# Patient Record
Sex: Female | Born: 1990
Health system: Southern US, Community
[De-identification: ages and names within clinical notes are randomized; demographics above are authoritative.]

## PROBLEM LIST (undated history)

## (undated) DIAGNOSIS — F32A Depression, unspecified: Secondary | ICD-10-CM

## (undated) DIAGNOSIS — F419 Anxiety disorder, unspecified: Secondary | ICD-10-CM

## (undated) DIAGNOSIS — S46219A Strain of muscle, fascia and tendon of other parts of biceps, unspecified arm, initial encounter: Secondary | ICD-10-CM

## (undated) HISTORY — PX: RHINOPLASTY: SUR1284

## (undated) HISTORY — PX: SEPTOPLASTY: SUR1290

---

## 2018-07-30 DIAGNOSIS — Z13220 Encounter for screening for lipoid disorders: Secondary | ICD-10-CM | POA: Diagnosis not present

## 2018-07-30 DIAGNOSIS — Z130 Encounter for screening for diseases of the blood and blood-forming organs and certain disorders involving the immune mechanism: Secondary | ICD-10-CM | POA: Diagnosis not present

## 2018-07-30 DIAGNOSIS — Z0000 Encounter for general adult medical examination without abnormal findings: Secondary | ICD-10-CM | POA: Diagnosis not present

## 2018-07-30 DIAGNOSIS — N926 Irregular menstruation, unspecified: Secondary | ICD-10-CM | POA: Diagnosis not present

## 2018-07-30 DIAGNOSIS — Z01419 Encounter for gynecological examination (general) (routine) without abnormal findings: Secondary | ICD-10-CM | POA: Diagnosis not present

## 2018-07-30 DIAGNOSIS — Z6821 Body mass index (BMI) 21.0-21.9, adult: Secondary | ICD-10-CM | POA: Diagnosis not present

## 2018-08-18 DIAGNOSIS — N926 Irregular menstruation, unspecified: Secondary | ICD-10-CM | POA: Diagnosis not present

## 2018-08-18 DIAGNOSIS — Z30430 Encounter for insertion of intrauterine contraceptive device: Secondary | ICD-10-CM | POA: Diagnosis not present

## 2018-08-18 DIAGNOSIS — N83201 Unspecified ovarian cyst, right side: Secondary | ICD-10-CM | POA: Diagnosis not present

## 2018-10-14 DIAGNOSIS — Z30431 Encounter for routine checking of intrauterine contraceptive device: Secondary | ICD-10-CM | POA: Diagnosis not present

## 2018-10-14 DIAGNOSIS — N83201 Unspecified ovarian cyst, right side: Secondary | ICD-10-CM | POA: Diagnosis not present

## 2019-01-22 ENCOUNTER — Other Ambulatory Visit: Payer: Self-pay

## 2019-01-22 DIAGNOSIS — Z20822 Contact with and (suspected) exposure to covid-19: Secondary | ICD-10-CM

## 2019-01-22 DIAGNOSIS — R6889 Other general symptoms and signs: Secondary | ICD-10-CM | POA: Diagnosis not present

## 2019-01-23 LAB — NOVEL CORONAVIRUS, NAA: SARS-CoV-2, NAA: NOT DETECTED

## 2019-03-15 DIAGNOSIS — M62830 Muscle spasm of back: Secondary | ICD-10-CM | POA: Diagnosis not present

## 2019-03-15 DIAGNOSIS — M9903 Segmental and somatic dysfunction of lumbar region: Secondary | ICD-10-CM | POA: Diagnosis not present

## 2019-03-15 DIAGNOSIS — M9902 Segmental and somatic dysfunction of thoracic region: Secondary | ICD-10-CM | POA: Diagnosis not present

## 2019-03-15 DIAGNOSIS — M9905 Segmental and somatic dysfunction of pelvic region: Secondary | ICD-10-CM | POA: Diagnosis not present

## 2019-05-05 DIAGNOSIS — Z20828 Contact with and (suspected) exposure to other viral communicable diseases: Secondary | ICD-10-CM | POA: Diagnosis not present

## 2019-06-28 ENCOUNTER — Ambulatory Visit: Payer: Medicaid Other | Attending: Internal Medicine

## 2019-06-28 DIAGNOSIS — Z20822 Contact with and (suspected) exposure to covid-19: Secondary | ICD-10-CM | POA: Diagnosis not present

## 2019-06-29 LAB — NOVEL CORONAVIRUS, NAA: SARS-CoV-2, NAA: NOT DETECTED

## 2019-07-16 DIAGNOSIS — L299 Pruritus, unspecified: Secondary | ICD-10-CM | POA: Diagnosis not present

## 2019-07-16 DIAGNOSIS — F419 Anxiety disorder, unspecified: Secondary | ICD-10-CM | POA: Diagnosis not present

## 2020-01-05 ENCOUNTER — Other Ambulatory Visit: Payer: Self-pay | Admitting: *Deleted

## 2020-01-05 ENCOUNTER — Ambulatory Visit: Payer: Medicaid Other | Attending: Internal Medicine

## 2020-01-05 DIAGNOSIS — Z20822 Contact with and (suspected) exposure to covid-19: Secondary | ICD-10-CM | POA: Diagnosis not present

## 2020-01-06 DIAGNOSIS — J019 Acute sinusitis, unspecified: Secondary | ICD-10-CM | POA: Diagnosis not present

## 2020-01-06 LAB — SARS-COV-2, NAA 2 DAY TAT

## 2020-01-06 LAB — NOVEL CORONAVIRUS, NAA: SARS-CoV-2, NAA: NOT DETECTED

## 2020-01-28 DIAGNOSIS — M25562 Pain in left knee: Secondary | ICD-10-CM | POA: Diagnosis not present

## 2020-02-04 DIAGNOSIS — B86 Scabies: Secondary | ICD-10-CM | POA: Diagnosis not present

## 2020-02-04 DIAGNOSIS — R21 Rash and other nonspecific skin eruption: Secondary | ICD-10-CM | POA: Diagnosis not present

## 2020-02-11 ENCOUNTER — Ambulatory Visit
Admission: RE | Admit: 2020-02-11 | Discharge: 2020-02-11 | Disposition: A | Discharge status: Home / Self Care | Payer: BC Managed Care – PPO | Source: Ambulatory Visit | Attending: Family Medicine | Admitting: Family Medicine

## 2020-02-11 ENCOUNTER — Ambulatory Visit: Payer: Self-pay

## 2020-02-11 ENCOUNTER — Other Ambulatory Visit: Payer: Self-pay

## 2020-02-11 ENCOUNTER — Ambulatory Visit: Payer: BC Managed Care – PPO | Admitting: Family Medicine

## 2020-02-11 VITALS — BP 94/68 | Ht 64.0 in | Wt 130.0 lb

## 2020-02-11 DIAGNOSIS — G8929 Other chronic pain: Secondary | ICD-10-CM

## 2020-02-11 DIAGNOSIS — M25562 Pain in left knee: Secondary | ICD-10-CM

## 2020-02-11 DIAGNOSIS — M7989 Other specified soft tissue disorders: Secondary | ICD-10-CM | POA: Diagnosis not present

## 2020-02-11 NOTE — Progress Notes (Signed)
PCP: Shon Hale, MD  Subjective:   CC: Patient is a 29 y.o. female here for knee pain.  HPI: H/o Staph infection in knee in high school. Unsure if it was soft tissue abscess or intra-articular but she was hospitalized for several days on antibiotics and was followed by a surgeon at that time. Off and on achiness in left knee since then.  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  achiness at patellar tendon region. Present at rest even.  Excessive use and bending makes it worse.  Has used a compression sleeve which worked for a while and uses ibuprofen which helps modestly.  Having a current flare and has been resting it. Going on for two weeks. Feels like she is kneeling on a lego. No erythema, but did have swelling. Feels a little bit swollen today.      Objective:  BP 94/68   Ht 5\' 4"  (1.626 m)   Wt 130 lb (59 kg)   BMI 22.31 kg/m   Physical Exam: Gen: NAD, comfortable in exam room L Knee: Observation: left knee has bony abnormality causing increased circumference of joint compared to left Palpation: tenderness to palpation of patellar tendon and tibial tuberosity. No effusion or bakers cyst. Negative crepitus with patellar grind ROM: mild decreased ROM in all directions compared to right with firm end point.  Strength: intact and symemtrical Sensation: intact Special tests: Positive pain with patellar grind Negative thesaly, drawer, lateral stress, mcmurry, lachman  : quadriceps tendon normal with no suprapatellar effusion.     Assessment & Plan:  Left knee pain Chronic L knee pain with deformity likely as a results from joint infection in high school. US findings consistent with chronic changes but no acute effusion or damage - ordering xray of knee including sunrise view for suspected patellar involvement - recommend wearing knee pads for kneeling at work - continue  tylenol/ibuprofen as needed - suspect that she may benefit from arthroscopy. Will discuss referral to ortho when results from xray are back   [place], DO [facility] PGY-3

## 2020-02-11 NOTE — Progress Notes (Signed)
Spoke w pt No abnormality seen

## 2020-02-11 NOTE — Assessment & Plan Note (Addendum)
Chronic L knee pain with deformity likely as a results from joint infection in high school. US findings consistent with chronic changes but no acute effusion or damage - ordering xray of knee including sunrise view for suspected patellar involvement - recommend wearing knee pads for kneeling at work - continue tylenol/ibuprofen as needed - suspect that she may benefit from arthroscopy. Will discuss referral to ortho when results from xray are back

## 2020-02-11 NOTE — Progress Notes (Signed)
[facility] Attending Note: I have seen and examined this patient. I have discussed this patient with the resident and reviewed the assessment and plan as documented above. I agree with the resident's findings and plan.   EXAM TTP pre-patellar bursa area but no specific bursa is identified.Popliteal space benign. Ligamentously intact to varus and valgus stress. Normal ACL testing. Very positive grind test. Slightly atypical tracking of both patella (right is medially tracked and left is laterally tracked)  KH:TXHF KNEE:  no appreciable effusion. Quadricept and patellar tendons intact without any defects. Medial and lateral menisci seen well and no specific defects notes. Patella without demonstrated abnormality. No prepatellar bursa is found.No popliteal fluid collection or cyst is found.  A/P: Knee pain, acute on chronic Suspicious for patellar irregularity on  Underside. MRI imaging would be useful. Might benefit from arthroscope ultimately. Will get plain films and if negative he will decide next step (MRI vs orthopedic referral_ She has good strength, doubt exercise program would benefit significantly.  Use of padding when kneeling recommended. I called her with X ray results and  She has decided to have ortho eval. We will set up.

## 2020-02-11 NOTE — Patient Instructions (Signed)
You obviously have some knee cap issues and I think the problems are from issues on the under neath side. Likely you will need imaging in addition to the ultrasound and the  X rays.   We discussed ordering the MRI nos vs going directly to ortho. Think it over and then let me know what you want to do next.  I would recommend contractor's knee pads regardless. Use them any time you have to kneel.  Great to meet you!

## 2020-02-14 ENCOUNTER — Other Ambulatory Visit: Payer: Self-pay

## 2020-02-14 DIAGNOSIS — M25562 Pain in left knee: Secondary | ICD-10-CM

## 2020-02-25 DIAGNOSIS — M25562 Pain in left knee: Secondary | ICD-10-CM | POA: Diagnosis not present

## 2020-03-02 DIAGNOSIS — Z13220 Encounter for screening for lipoid disorders: Secondary | ICD-10-CM | POA: Diagnosis not present

## 2020-03-02 DIAGNOSIS — Z23 Encounter for immunization: Secondary | ICD-10-CM | POA: Diagnosis not present

## 2020-03-02 DIAGNOSIS — Z0000 Encounter for general adult medical examination without abnormal findings: Secondary | ICD-10-CM | POA: Diagnosis not present

## 2020-05-10 DIAGNOSIS — J00 Acute nasopharyngitis [common cold]: Secondary | ICD-10-CM | POA: Diagnosis not present

## 2020-05-10 DIAGNOSIS — Z03818 Encounter for observation for suspected exposure to other biological agents ruled out: Secondary | ICD-10-CM | POA: Diagnosis not present

## 2020-10-06 DIAGNOSIS — M7551 Bursitis of right shoulder: Secondary | ICD-10-CM | POA: Diagnosis not present

## 2021-03-12 DIAGNOSIS — Z13220 Encounter for screening for lipoid disorders: Secondary | ICD-10-CM | POA: Diagnosis not present

## 2021-03-12 DIAGNOSIS — Z23 Encounter for immunization: Secondary | ICD-10-CM | POA: Diagnosis not present

## 2021-03-12 DIAGNOSIS — Z0000 Encounter for general adult medical examination without abnormal findings: Secondary | ICD-10-CM | POA: Diagnosis not present

## 2021-07-13 IMAGING — DX DG KNEE AP/LAT W/ SUNRISE*L*
3 series · 3 of 3 positions shown · non-contrast
Comparison: None.

CLINICAL DATA: Chronic Left anterior knee pain at the patella with
mild swelling , NKI

EXAM:
LEFT KNEE 3 VIEWS

[dg knee ap/lat w/ sunrise left (1 of 3)]
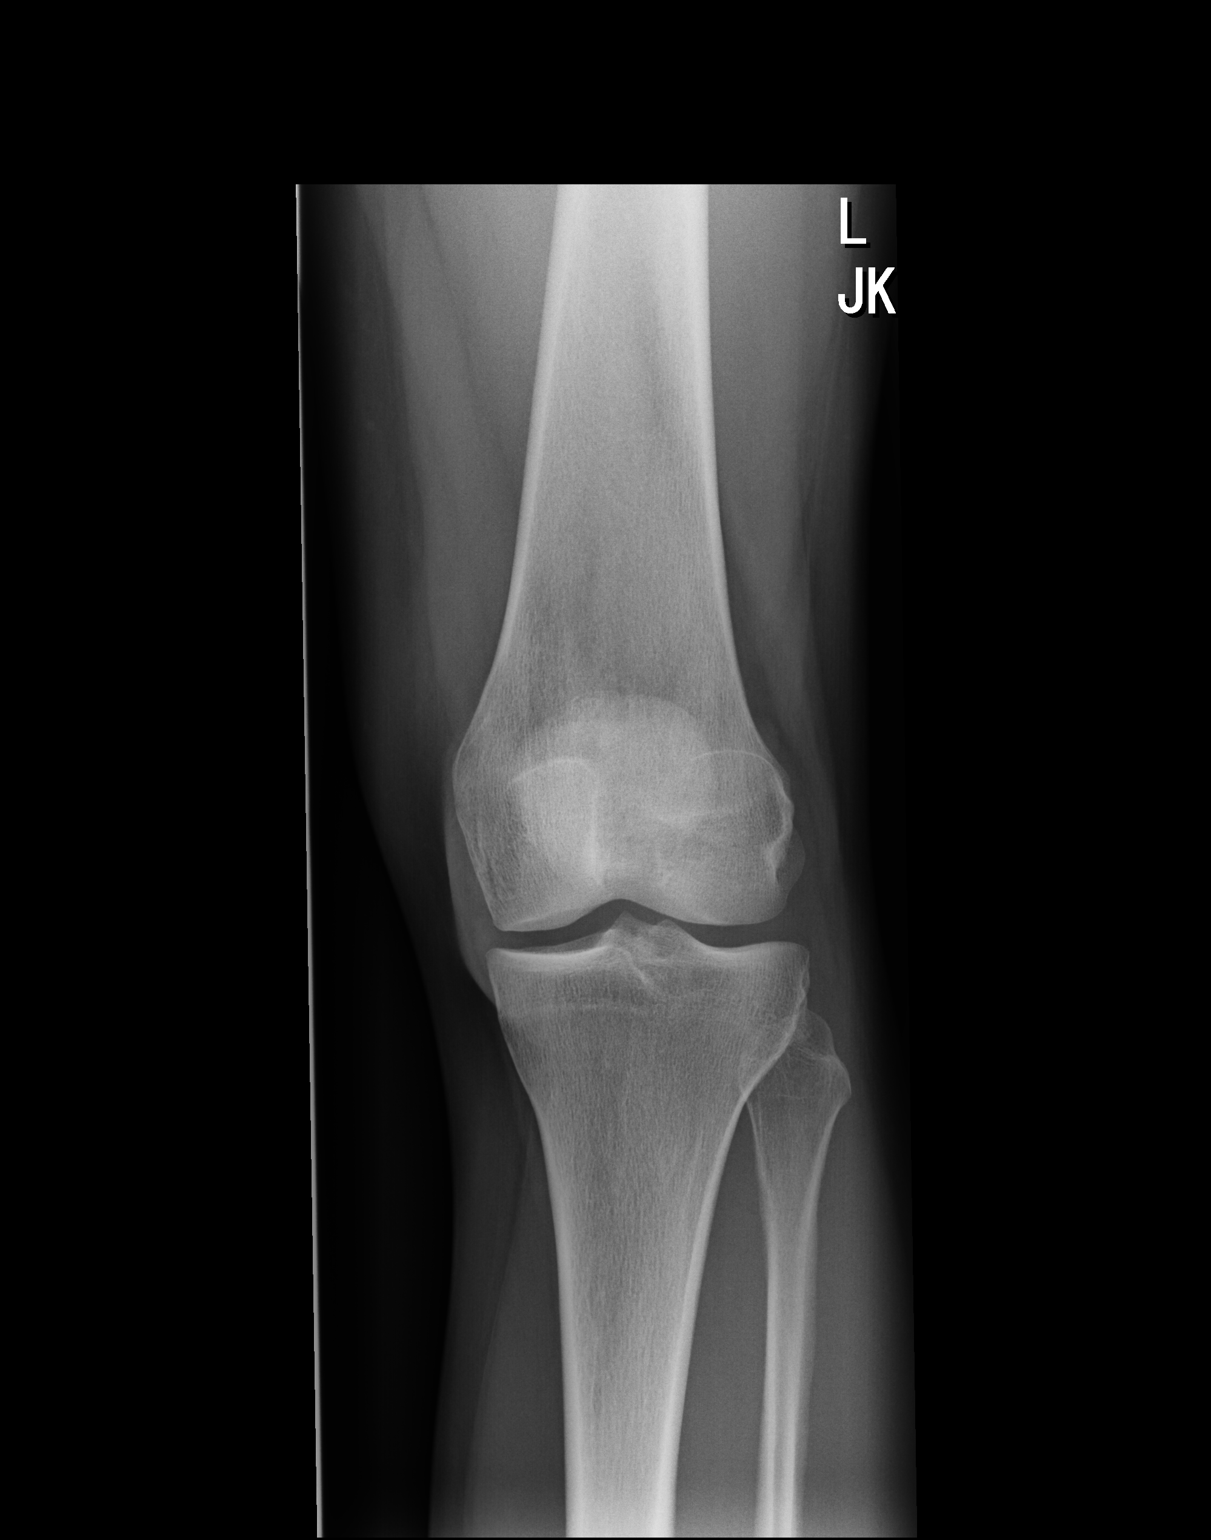

[dg knee ap/lat w/ sunrise left (2 of 3)]
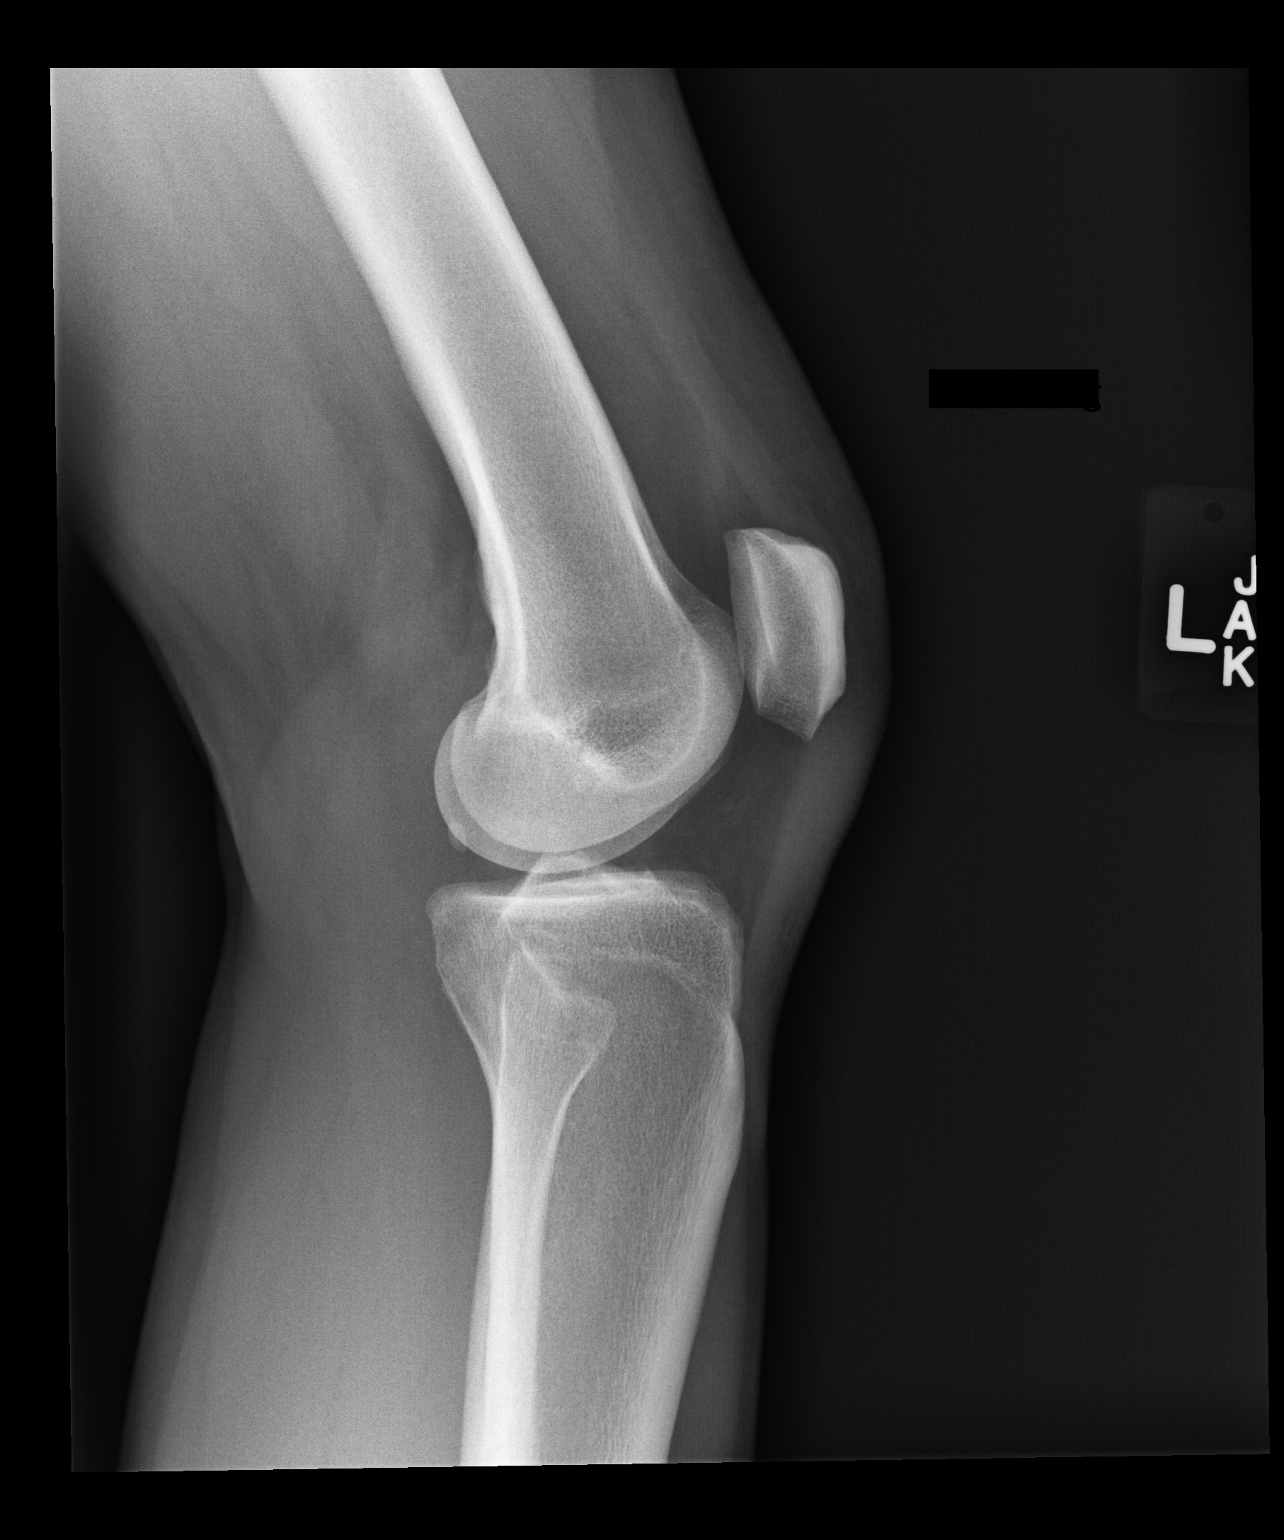

[dg knee ap/lat w/ sunrise left (3 of 3)]
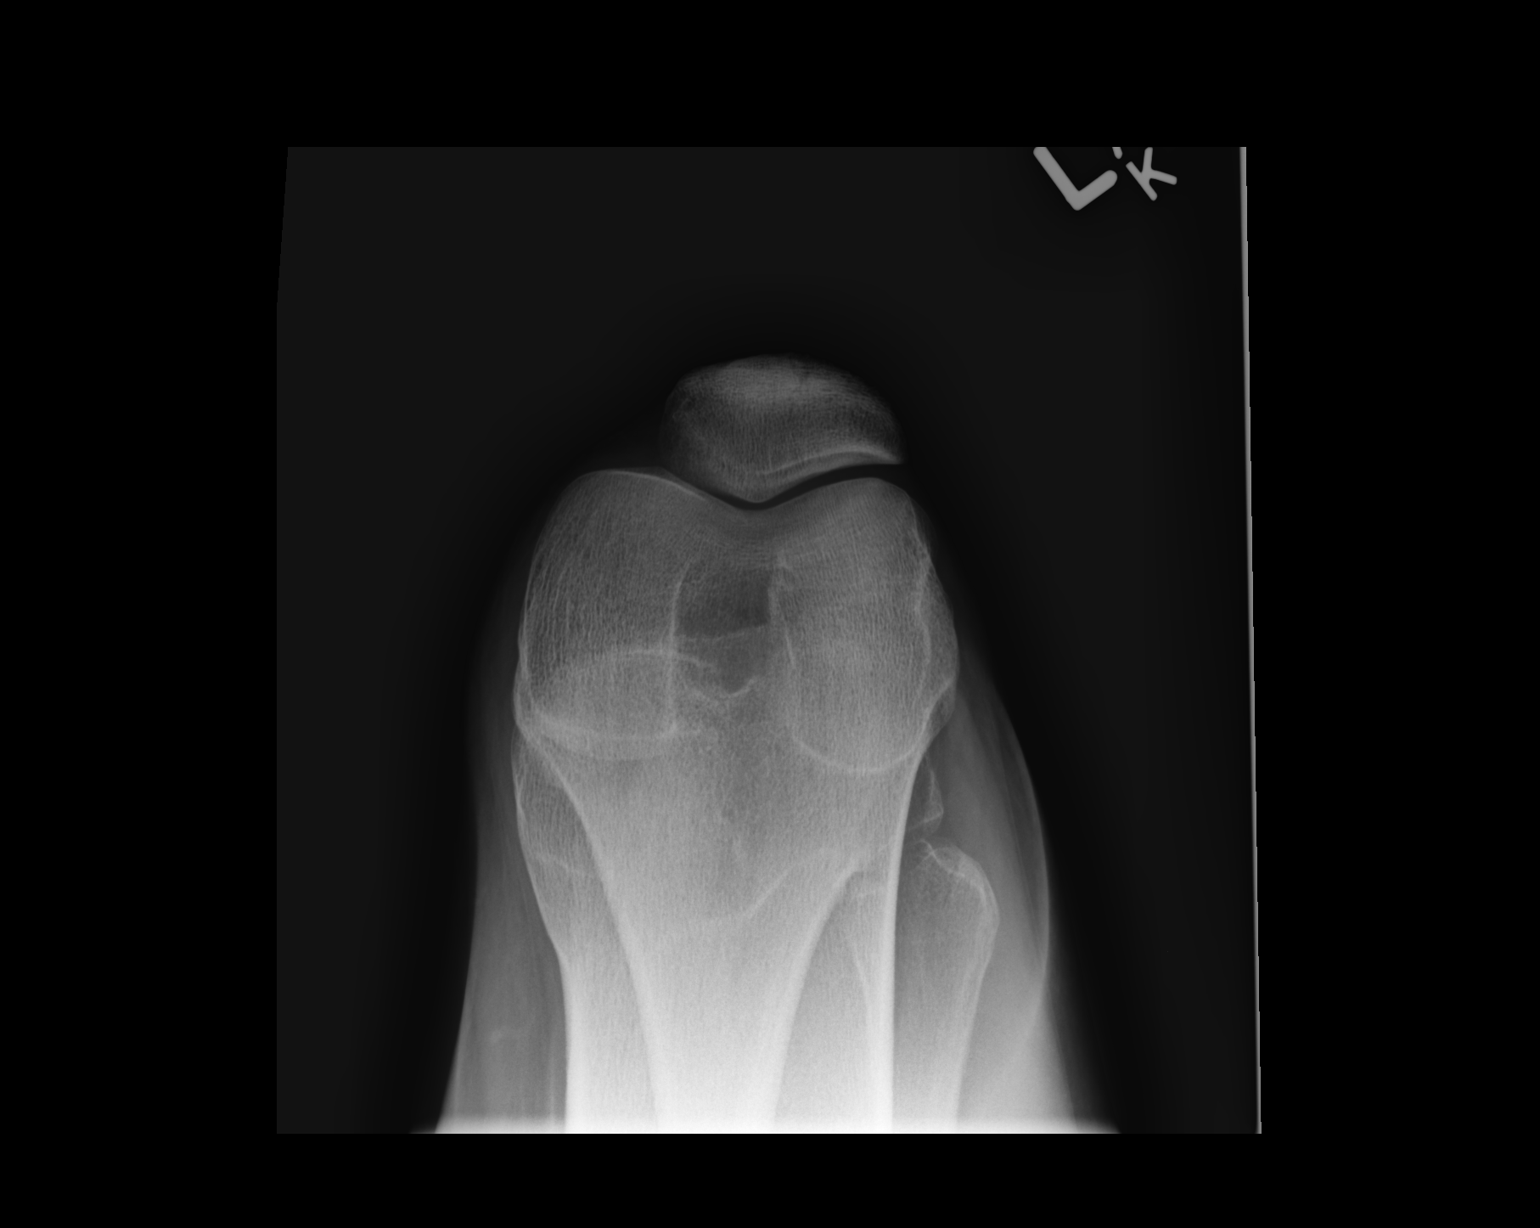

[3 of 3 positions shown; findings below may reference images not displayed]

FINDINGS: No evidence of fracture, dislocation, or joint effusion. No evidence
of arthropathy or other focal bone abnormality. Soft tissues are
unremarkable.
IMPRESSION: Negative left knee radiographs.

## 2022-05-29 DIAGNOSIS — Z0000 Encounter for general adult medical examination without abnormal findings: Secondary | ICD-10-CM | POA: Diagnosis not present

## 2022-05-29 DIAGNOSIS — Z23 Encounter for immunization: Secondary | ICD-10-CM | POA: Diagnosis not present

## 2022-09-24 DIAGNOSIS — M25512 Pain in left shoulder: Secondary | ICD-10-CM | POA: Diagnosis not present

## 2022-10-01 DIAGNOSIS — M25512 Pain in left shoulder: Secondary | ICD-10-CM | POA: Diagnosis not present

## 2023-01-08 DIAGNOSIS — F411 Generalized anxiety disorder: Secondary | ICD-10-CM | POA: Diagnosis not present

## 2023-01-14 DIAGNOSIS — S43432A Superior glenoid labrum lesion of left shoulder, initial encounter: Secondary | ICD-10-CM | POA: Diagnosis not present

## 2023-02-20 DIAGNOSIS — Z23 Encounter for immunization: Secondary | ICD-10-CM | POA: Diagnosis not present

## 2023-02-20 DIAGNOSIS — S0183XA Puncture wound without foreign body of other part of head, initial encounter: Secondary | ICD-10-CM | POA: Diagnosis not present

## 2023-03-12 DIAGNOSIS — M25512 Pain in left shoulder: Secondary | ICD-10-CM | POA: Diagnosis not present

## 2023-04-08 DIAGNOSIS — M25512 Pain in left shoulder: Secondary | ICD-10-CM | POA: Diagnosis not present

## 2023-04-10 ENCOUNTER — Other Ambulatory Visit: Payer: Self-pay

## 2023-04-10 ENCOUNTER — Encounter (HOSPITAL_BASED_OUTPATIENT_CLINIC_OR_DEPARTMENT_OTHER): Payer: Self-pay | Admitting: Orthopaedic Surgery

## 2023-04-15 NOTE — H&P (Cosign Needed)
PREOPERATIVE H&P  Chief Complaint: right shoulder cartilage disorder, OA, impingement syndrome,biceps tenodesis.  HPI: Yolanda Chandler is a 32 y.o. female who is scheduled for, Procedure(s): SHOULDER ARTHROSCOPY WITH SUBACROMIAL DECOMPRESSION AND BICEP TENDON REPAIR SHOULDER ARTHROSCOPY WITH DISTAL CLAVICLE RESECTION.    Patient is following up on presumptive SLAP tear.  She continues to have pain.  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     Symptoms are rated as moderate to severe, and have been worsening.  This is significantly impairing activities of daily living.    Please see clinic note for further details on this patient's care.    She has elected for surgical management.   Past Medical History:  Diagnosis Date   Anxiety    Biceps tendon rupture    Depression    Past Surgical History:  Procedure Laterality Date   RHINOPLASTY     SEPTOPLASTY     Social History   Socioeconomic History   Marital status: Single    Spouse name: Not on file   Number of children: Not on file   Years of education: Not on file   Highest education level: Not on file  Occupational History   Not on file  Tobacco Use   Smoking status: Never   Smokeless tobacco: Never  Vaping Use   Vaping status: Never Used  Substance and Sexual Activity   Alcohol use: Yes    Comment: weekly   Drug use: Never   Sexual activity: Not on file  Other Topics Concern   Not on file  Social History Narrative   Not on file   Social Determinants of Health   Financial Resource Strain: Not on file  Food Insecurity: Not on file  Transportation Needs: Not on file  Physical Activity: Not on file  Stress: Not on file  Social Connections: Not on file   History reviewed. No pertinent family history. Allergies  Allergen Reactions   Amoxicillin Hives   Sulfa Antibiotics Hives   Prior to Admission medications   Medication Sig Start Date End Date Taking? Authorizing Provider  buPROPion  (WELLBUTRIN XL) 150 MG 24 hr tablet Take 150 mg by mouth daily.   Yes [provider]  citalopram (CELEXA) 40 MG tablet Take 40 mg by mouth daily.   Yes [provider]  levonorgestrel (KYLEENA) 19.5 MG IUD 1 each by Intrauterine route once.   Yes [provider]    ROS: All other systems have been reviewed and were otherwise negative with the exception of those mentioned in the HPI and as above.  Physical Exam: General: Alert, no acute distress Cardiovascular: No pedal edema Respiratory: No cyanosis, no use of accessory musculature GI: No organomegaly, abdomen is soft and non-tender Skin: No lesions in the area of chief complaint Neurologic: Sensation intact distally Psychiatric: Patient is competent for consent with normal mood and affect Lymphatic: No axillary or cervical lymphadenopathy  MUSCULOSKELETAL:  Range of motion of the shoulder is full.  Grossly positive Speed's and O'Brien's, as well as bicipital groove tenderness to palpation.  She has some mild apprehension of the anterior apprehension position.    Imaging: MRI demonstrates an obvious SLAP tear.  Subacromial bursitis, as well as fluid around the biceps with mild tendinosis of the biceps as well.    Assessment: right shoulder cartilage disorder, OA, impingement syndrome,biceps tenodesis.  Plan: Plan for Procedure(s): SHOULDER ARTHROSCOPY WITH SUBACROMIAL DECOMPRESSION AND BICEP TENDON REPAIR SHOULDER ARTHROSCOPY WITH DISTAL CLAVICLE RESECTION  The risks benefits and alternatives were discussed with the patient including but not limited to the risks of nonoperative treatment, versus surgical intervention including infection, bleeding, nerve injury,  blood clots, cardiopulmonary complications, morbidity, mortality, among others, and they were willing to proceed.   The patient acknowledged the explanation, agreed to proceed with the plan and consent was signed.   Operative Plan: Right  shoulder arthroscopy, bicep tenodesis, subacromial decompression, distal clavicle excision  Discharge Medications: Oxycodone, Zofran, Tylenol, Meloxicam DVT Prophylaxis: none indicated  Physical Therapy: 11/5 with [facility] Special Discharge needs: Sling, Gennaro Africa, PA-C  04/15/2023 10:41 AM

## 2023-04-15 NOTE — Anesthesia Preprocedure Evaluation (Signed)
Anesthesia Evaluation  Patient identified by MRN, date of birth, ID band Patient awake    Reviewed: Allergy & Precautions, NPO status , Patient's Chart, lab work & pertinent test results  History of Anesthesia Complications Negative for: history of anesthetic complications  Airway Mallampati: I  TM Distance: >3 FB Neck ROM: Full    Dental  (+) Dental Advisory Given   Pulmonary neg pulmonary ROS   Pulmonary exam normal breath sounds clear to auscultation       Cardiovascular negative cardio ROS  Rhythm:Regular Rate:Normal     Neuro/Psych  PSYCHIATRIC DISORDERS Anxiety Depression    negative neurological ROS     GI/Hepatic negative GI ROS, Neg liver ROS,,,  Endo/Other  negative endocrine ROS    Renal/GU negative Renal ROS     Musculoskeletal   Abdominal   Peds  Hematology negative hematology ROS (+)   Anesthesia Other Findings   Reproductive/Obstetrics                             Anesthesia Physical Anesthesia Plan  ASA: 1  Anesthesia Plan: General and Regional   Post-op Pain Management: Regional block* and Tylenol PO (pre-op)*   Induction: Intravenous  PONV Risk Score and Plan: 3 and Ondansetron, Dexamethasone and Treatment may vary due to age or medical condition  Airway Management Planned: Oral ETT  Additional Equipment:   Intra-op Plan:   Post-operative Plan: Extubation in OR  Informed Consent: I have reviewed the patients History and Physical, chart, labs and discussed the procedure including the risks, benefits and alternatives for the proposed anesthesia with the patient or authorized representative who has indicated his/her understanding and acceptance.     Dental advisory given  Plan Discussed with: CRNA and Anesthesiologist  Anesthesia Plan Comments: (Discussed potential risks of nerve blocks including, but not limited to, infection, bleeding, nerve damage,  seizures, pneumothorax, respiratory depression, and potential failure of the block. Alternatives to nerve blocks discussed. All questions answered.  Risks of general anesthesia discussed including, but not limited to, sore throat, hoarse voice, chipped/damaged teeth, injury to vocal cords, nausea and vomiting, allergic reactions, lung infection, heart attack, stroke, and death. All questions answered. )        Anesthesia Quick Evaluation

## 2023-04-16 NOTE — Discharge Instructions (Addendum)
Ramond Marrow MD, MPH Alfonse Alpers, PA-C [facility] [address], [address] [phone] (tel)   [phone] (fax)   POST-OPERATIVE INSTRUCTIONS - SHOULDER ARTHROSCOPY  WOUND CARE You may remove the Operative Dressing on Post-Op Day #3 (72hrs after surgery).   Alternatively if you would like you can leave dressing on until follow-up if within 7-8 days but keep it dry. Leave steri-strips in place until they fall off on their own, usually 2 weeks postop. There may be a small amount of fluid/bleeding leaking at the surgical site.  This is normal; the shoulder is filled with fluid during the procedure and can leak for 24-48hrs after surgery.  You may change/reinforce the bandage as needed.  Use the Cryocuff or Ice as often as possible for the first 7 days, then as needed for pain relief. Always keep a towel, ACE wrap or other barrier between the cooling unit and your skin.  You may shower on Post-Op Day #3. Gently pat the area dry.  Do not soak the shoulder in water or submerge it.  Keep incisions as dry as possible. Do not go swimming in the pool or ocean until 4 weeks after surgery or when otherwise instructed.    EXERCISES Wear the sling at all times  You may remove the sling for showering, but keep the arm across the chest or in a secondary sling.     It is normal for your fingers/hand to become more swollen after surgery and discolored from bruising.   This will resolve over the first few weeks usually after surgery. Please continue to ambulate and do not stay sitting or lying for too long.  Perform foot and wrist pumps to assist in circulation.  PHYSICAL THERAPY - You will begin physical therapy soon after surgery (unless otherwise specified) - Please call to set up an appointment, if you do not already have one  - Let our office if there are any issues with scheduling your therapy  - You have a physical therapy appointment scheduled at [facility] (across  the hall from our office) on 11/5   REGIONAL ANESTHESIA (NERVE BLOCKS) The anesthesia team may have performed a nerve block for you this is a great tool used to minimize pain.   The block may start wearing off overnight (between 8-24 hours postop) When the block wears off, your pain may go from nearly zero to the pain you would have had postop without the block. This is an abrupt transition but nothing dangerous is happening.   This can be a challenging period but utilize your as needed pain medications to try and manage this period. We suggest you use the pain medication the first night prior to going to bed, to ease this transition.  You may take an extra dose of narcotic when this happens if needed  POST-OP MEDICATIONS- Multimodal approach to pain control In general your pain will be controlled with a combination of substances.  Prescriptions unless otherwise discussed are electronically sent to your pharmacy.  This is a carefully made plan we use to minimize narcotic use.     Meloxicam - Anti-inflammatory medication taken on a scheduled basis Acetaminophen - Non-narcotic pain medicine taken on a scheduled basis  Oxycodone - This is a strong narcotic, to be used only on an "as needed" basis for SEVERE pain. Omeprazole - daily medicine to protect your stomach while taking anti-inflammatories.   Zofran - take as needed for nausea   FOLLOW-UP If you develop  a Fever (>=101.5), Redness or Drainage from the surgical incision site, please call our office to arrange for an evaluation. Please call the office to schedule a follow-up appointment for your first post-operative appointment, 7-10 days post-operatively.    HELPFUL INFORMATION   You may be more comfortable sleeping in a semi-seated position the first few nights following surgery.  Keep a pillow propped under the elbow and forearm for comfort.  If you have a recliner type of chair it might be beneficial.  If not that is fine too, but  it would be helpful to sleep propped up with pillows behind your operated shoulder as well under your elbow and forearm.  This will reduce pulling on the suture lines.  When dressing, put your operative arm in the sleeve first.  When getting undressed, take your operative arm out last.  Loose fitting, button-down shirts are recommended.  Often in the first days after surgery you may be more comfortable keeping your operative arm under your shirt and not through the sleeve.  You may return to work/school in the next couple of days when you feel up to it.  Desk work and typing in the sling is fine.  We suggest you use the pain medication the first night prior to going to bed, in order to ease any pain when the anesthesia wears off. You should avoid taking pain medications on an empty stomach as it will make you nauseous.  You should wean off your narcotic medicines as soon as you are able.  Most patients will be off narcotics before their first postop appointment.   Do not drink alcoholic beverages or take illicit drugs when taking pain medications.  It is against the law to drive while taking narcotics.  In some states it is against the law to drive while your arm is in a sling.   Pain medication may make you constipated.  Below are a few solutions to try in this order: Decrease the amount of pain medication if you aren't having pain. Drink lots of decaffeinated fluids. Drink prune juice and/or eat dried prunes  If the first 3 don't work start with additional solutions Take Colace - an over-the-counter stool softener Take Senokot - an over-the-counter laxative Take Miralax - a stronger over-the-counter laxative  For more information including helpful videos and documents visit our website:   [email or web address]-[email or web address]         No Tylenol before 1:45pm.  Post Anesthesia Home Care Instructions  Activity: Get plenty of rest for the remainder of the day. A  responsible individual must stay with you for 24 hours following the procedure.  For the next 24 hours, DO NOT: -Drive a car -Advertising copywriter -Drink alcoholic beverages -Take any medication unless instructed by your physician -Make any legal decisions or sign important papers.  Meals: Start with liquid foods such as gelatin or soup. Progress to regular foods as tolerated. Avoid greasy, spicy, heavy foods. If nausea and/or vomiting occur, drink only clear liquids until the nausea and/or vomiting subsides. Call your physician if vomiting continues.  Special Instructions/Symptoms: Your throat may feel dry or sore from the anesthesia or the breathing tube placed in your throat during surgery. If this causes discomfort, gargle with warm salt water. The discomfort should disappear within 24 hours.     Regional Anesthesia Blocks  1. You may not be able to move or feel the "blocked" extremity after a regional anesthetic block. This may last may last from 3-48 hours after placement,  but it will go away. The length of time depends on the medication injected and your individual response to the medication. As the nerves start to wake up, you may experience tingling as the movement and feeling returns to your extremity. If the numbness and inability to move your extremity has not gone away after 48 hours, please call your surgeon.   2. The extremity that is blocked will need to be protected until the numbness is gone and the strength has returned. Because you cannot feel it, you will need to take extra care to avoid injury. Because it may be weak, you may have difficulty moving it or using it. You may not know what position it is in without looking at it while the block is in effect.  3. For blocks in the legs and feet, returning to weight bearing and walking needs to be done carefully. You will need to wait until the numbness is entirely gone and the strength has returned. You should be able to move your  leg and foot normally before you try and bear weight or walk. You will need someone to be with you when you first try to ensure you do not fall and possibly risk injury.  4. Bruising and tenderness at the needle site are common side effects and will resolve in a few days.  5. Persistent numbness or new problems with movement should be communicated to the surgeon or the [facility] [phone] [facility] [phone]).

## 2023-04-17 ENCOUNTER — Encounter (HOSPITAL_BASED_OUTPATIENT_CLINIC_OR_DEPARTMENT_OTHER): Payer: Self-pay | Admitting: Orthopaedic Surgery

## 2023-04-17 ENCOUNTER — Ambulatory Visit (HOSPITAL_BASED_OUTPATIENT_CLINIC_OR_DEPARTMENT_OTHER)
Admission: RE | Admit: 2023-04-17 | Discharge: 2023-04-17 | Disposition: A | Discharge status: Home / Self Care | Payer: 59 | Attending: Orthopaedic Surgery | Admitting: Orthopaedic Surgery

## 2023-04-17 ENCOUNTER — Other Ambulatory Visit: Payer: Self-pay

## 2023-04-17 ENCOUNTER — Ambulatory Visit (HOSPITAL_BASED_OUTPATIENT_CLINIC_OR_DEPARTMENT_OTHER): Payer: 59 | Admitting: Anesthesiology

## 2023-04-17 ENCOUNTER — Encounter (HOSPITAL_BASED_OUTPATIENT_CLINIC_OR_DEPARTMENT_OTHER)
Admission: RE | Disposition: A | Discharge status: Home / Self Care | Payer: Self-pay | Source: Home / Self Care | Attending: Orthopaedic Surgery

## 2023-04-17 ENCOUNTER — Ambulatory Visit (HOSPITAL_BASED_OUTPATIENT_CLINIC_OR_DEPARTMENT_OTHER): Payer: Self-pay | Admitting: Anesthesiology

## 2023-04-17 DIAGNOSIS — M7521 Bicipital tendinitis, right shoulder: Secondary | ICD-10-CM | POA: Insufficient documentation

## 2023-04-17 DIAGNOSIS — M948X1 Other specified disorders of cartilage, shoulder: Secondary | ICD-10-CM | POA: Insufficient documentation

## 2023-04-17 DIAGNOSIS — M7541 Impingement syndrome of right shoulder: Secondary | ICD-10-CM | POA: Insufficient documentation

## 2023-04-17 DIAGNOSIS — Z01818 Encounter for other preprocedural examination: Secondary | ICD-10-CM

## 2023-04-17 DIAGNOSIS — M19011 Primary osteoarthritis, right shoulder: Secondary | ICD-10-CM | POA: Insufficient documentation

## 2023-04-17 DIAGNOSIS — G8918 Other acute postprocedural pain: Secondary | ICD-10-CM | POA: Diagnosis not present

## 2023-04-17 HISTORY — PX: SHOULDER ARTHROSCOPY WITH SUBACROMIAL DECOMPRESSION AND BICEP TENDON REPAIR: SHX5689

## 2023-04-17 HISTORY — DX: Depression, unspecified: F32.A

## 2023-04-17 HISTORY — DX: Anxiety disorder, unspecified: F41.9

## 2023-04-17 HISTORY — DX: Strain of muscle, fascia and tendon of other parts of biceps, unspecified arm, initial encounter: S46.219A

## 2023-04-17 HISTORY — PX: SHOULDER ARTHROSCOPY WITH DISTAL CLAVICLE RESECTION: SHX5675

## 2023-04-17 LAB — POCT PREGNANCY, URINE: Preg Test, Ur: NEGATIVE

## 2023-04-17 SURGERY — SHOULDER ARTHROSCOPY WITH SUBACROMIAL DECOMPRESSION AND BICEP TENDON REPAIR
Anesthesia: Regional | Site: Shoulder | Laterality: Right

## 2023-04-17 MED ORDER — LACTATED RINGERS IV SOLN: INTRAVENOUS | Status: DC | PRN

## 2023-04-17 MED ORDER — LACTATED RINGERS IV SOLN: INTRAVENOUS | Status: DC

## 2023-04-17 MED ORDER — DEXAMETHASONE SODIUM PHOSPHATE 10 MG/ML IJ SOLN
INTRAMUSCULAR | Status: DC | PRN
  Administered 2023-04-17: 10 mg via INTRAVENOUS

## 2023-04-17 MED ORDER — SODIUM CHLORIDE 0.9 % IV SOLN: INTRAVENOUS | Status: DC | PRN

## 2023-04-17 MED ORDER — PHENYLEPHRINE HCL (PRESSORS) 10 MG/ML IV SOLN
INTRAVENOUS | Status: DC | PRN
  Administered 2023-04-17: 80 ug via INTRAVENOUS

## 2023-04-17 MED ORDER — DEXAMETHASONE SODIUM PHOSPHATE 10 MG/ML IJ SOLN
INTRAMUSCULAR | Status: AC
  Filled 2023-04-17: qty 1

## 2023-04-17 MED ORDER — ROCURONIUM BROMIDE 10 MG/ML (PF) SYRINGE
PREFILLED_SYRINGE | INTRAVENOUS | Status: AC
  Filled 2023-04-17: qty 10

## 2023-04-17 MED ORDER — PHENYLEPHRINE 80 MCG/ML (10ML) SYRINGE FOR IV PUSH (FOR BLOOD PRESSURE SUPPORT)
PREFILLED_SYRINGE | INTRAVENOUS | Status: AC
  Filled 2023-04-17: qty 10

## 2023-04-17 MED ORDER — ACETAMINOPHEN 500 MG PO TABS
1000.0000 mg | ORAL_TABLET | Freq: Three times a day (TID) | ORAL | 0 refills | Status: AC
Start: 2023-04-17 — End: 2023-05-01

## 2023-04-17 MED ORDER — TRANEXAMIC ACID-NACL 1000-0.7 MG/100ML-% IV SOLN
1000.0000 mg | INTRAVENOUS | Status: AC
  Administered 2023-04-17: 1000 mg via INTRAVENOUS

## 2023-04-17 MED ORDER — EPHEDRINE SULFATE (PRESSORS) 50 MG/ML IJ SOLN
INTRAMUSCULAR | Status: DC | PRN
  Administered 2023-04-17: 10 mg via INTRAVENOUS
  Administered 2023-04-17 (×4): 5 mg via INTRAVENOUS

## 2023-04-17 MED ORDER — SODIUM CHLORIDE 0.9 % IR SOLN
Status: DC | PRN
  Administered 2023-04-17: 6000 mL

## 2023-04-17 MED ORDER — ONDANSETRON HCL 4 MG/2ML IJ SOLN
INTRAMUSCULAR | Status: DC | PRN
  Administered 2023-04-17: 4 mg via INTRAVENOUS

## 2023-04-17 MED ORDER — OXYCODONE HCL 5 MG/5ML PO SOLN
5.0000 mg | Freq: Once | ORAL | Status: DC | PRN
Start: 2023-04-17 — End: 2023-04-17

## 2023-04-17 MED ORDER — LIDOCAINE 2% (20 MG/ML) 5 ML SYRINGE
INTRAMUSCULAR | Status: DC | PRN
  Administered 2023-04-17: 60 mg via INTRAVENOUS

## 2023-04-17 MED ORDER — ONDANSETRON HCL 4 MG PO TABS: 4.0000 mg | ORAL_TABLET | Freq: Three times a day (TID) | ORAL | 0 refills | Status: AC | PRN

## 2023-04-17 MED ORDER — EPHEDRINE 5 MG/ML INJ
INTRAVENOUS | Status: AC
  Filled 2023-04-17: qty 5

## 2023-04-17 MED ORDER — ACETAMINOPHEN 500 MG PO TABS
1000.0000 mg | ORAL_TABLET | Freq: Once | ORAL | Status: AC
  Administered 2023-04-17: 1000 mg via ORAL

## 2023-04-17 MED ORDER — CEFAZOLIN SODIUM-DEXTROSE 2-4 GM/100ML-% IV SOLN
2.0000 g | INTRAVENOUS | Status: AC
  Administered 2023-04-17: 2 g via INTRAVENOUS

## 2023-04-17 MED ORDER — LIDOCAINE 2% (20 MG/ML) 5 ML SYRINGE
INTRAMUSCULAR | Status: AC
  Filled 2023-04-17: qty 5

## 2023-04-17 MED ORDER — ROCURONIUM BROMIDE 100 MG/10ML IV SOLN
INTRAVENOUS | Status: DC | PRN
  Administered 2023-04-17: 50 mg via INTRAVENOUS

## 2023-04-17 MED ORDER — BUPIVACAINE HCL (PF) 0.5 % IJ SOLN
INTRAMUSCULAR | Status: DC | PRN
  Administered 2023-04-17: 10 mL via PERINEURAL

## 2023-04-17 MED ORDER — FENTANYL CITRATE (PF) 100 MCG/2ML IJ SOLN
INTRAMUSCULAR | Status: DC | PRN
  Administered 2023-04-17: 50 ug via INTRAVENOUS

## 2023-04-17 MED ORDER — CEFAZOLIN SODIUM-DEXTROSE 2-4 GM/100ML-% IV SOLN
INTRAVENOUS | Status: AC
Start: 2023-04-17 — End: ?
  Filled 2023-04-17: qty 100

## 2023-04-17 MED ORDER — OXYCODONE HCL 5 MG PO TABS: 5.0000 mg | ORAL_TABLET | Freq: Once | ORAL | Status: DC | PRN

## 2023-04-17 MED ORDER — MELOXICAM 7.5 MG PO TBDP: 7.5000 mg | ORAL_TABLET | Freq: Two times a day (BID) | ORAL | 0 refills | Status: AC

## 2023-04-17 MED ORDER — AMISULPRIDE (ANTIEMETIC) 5 MG/2ML IV SOLN: 10.0000 mg | Freq: Once | INTRAVENOUS | Status: DC | PRN

## 2023-04-17 MED ORDER — ACETAMINOPHEN 500 MG PO TABS
ORAL_TABLET | ORAL | Status: AC
Start: 2023-04-17 — End: ?
  Filled 2023-04-17: qty 2

## 2023-04-17 MED ORDER — MIDAZOLAM HCL 2 MG/2ML IJ SOLN
2.0000 mg | Freq: Once | INTRAMUSCULAR | Status: AC
  Administered 2023-04-17: 2 mg via INTRAVENOUS

## 2023-04-17 MED ORDER — FENTANYL CITRATE (PF) 100 MCG/2ML IJ SOLN
INTRAMUSCULAR | Status: AC
  Filled 2023-04-17: qty 2

## 2023-04-17 MED ORDER — FENTANYL CITRATE (PF) 100 MCG/2ML IJ SOLN: 25.0000 ug | INTRAMUSCULAR | Status: DC | PRN

## 2023-04-17 MED ORDER — BUPIVACAINE LIPOSOME 1.3 % IJ SUSP
INTRAMUSCULAR | Status: DC | PRN
  Administered 2023-04-17: 10 mL via PERINEURAL

## 2023-04-17 MED ORDER — ONDANSETRON HCL 4 MG/2ML IJ SOLN
INTRAMUSCULAR | Status: AC
  Filled 2023-04-17: qty 2

## 2023-04-17 MED ORDER — SUGAMMADEX SODIUM 200 MG/2ML IV SOLN
INTRAVENOUS | Status: DC | PRN
  Administered 2023-04-17: 200 mg via INTRAVENOUS

## 2023-04-17 MED ORDER — PROPOFOL 10 MG/ML IV BOLUS
INTRAVENOUS | Status: AC
  Filled 2023-04-17: qty 20

## 2023-04-17 MED ORDER — MIDAZOLAM HCL 2 MG/2ML IJ SOLN
INTRAMUSCULAR | Status: AC
  Filled 2023-04-17: qty 2

## 2023-04-17 MED ORDER — PROPOFOL 10 MG/ML IV BOLUS
INTRAVENOUS | Status: DC | PRN
  Administered 2023-04-17: 150 mg via INTRAVENOUS

## 2023-04-17 MED ORDER — OXYCODONE HCL 5 MG PO TABS: ORAL_TABLET | ORAL | 0 refills | Status: AC

## 2023-04-17 MED ORDER — TRANEXAMIC ACID-NACL 1000-0.7 MG/100ML-% IV SOLN
INTRAVENOUS | Status: AC
  Filled 2023-04-17: qty 100

## 2023-04-17 MED ORDER — EPINEPHRINE PF 1 MG/ML IJ SOLN
INTRAMUSCULAR | Status: AC
  Filled 2023-04-17: qty 2

## 2023-04-17 SURGICAL SUPPLY — 53 items
AID PSTN UNV HD RSTRNT DISP (MISCELLANEOUS) ×1
ANCH SUT 2 FBRTK KNTLS 1.8 (Anchor) ×2 IMPLANT
ANCHOR SUT 1.8 FIBERTAK SB KL (Anchor) IMPLANT
APL PRP STRL LF DISP 70% ISPRP (MISCELLANEOUS) ×1
BLADE EXCALIBUR 4.0X13 (MISCELLANEOUS) ×1 IMPLANT
BURR OVAL 8 FLU 4.0X13 (MISCELLANEOUS) ×1 IMPLANT
CANNULA 5.75X71 LONG (CANNULA) IMPLANT
CANNULA PASSPORT 5 (CANNULA) IMPLANT
CANNULA PASSPORT BUTTON 10-40 (CANNULA) IMPLANT
CANNULA TWIST IN 8.25X7CM (CANNULA) IMPLANT
CHLORAPREP W/TINT 26 (MISCELLANEOUS) ×1 IMPLANT
CLSR STERI-STRIP ANTIMIC 1/2X4 (GAUZE/BANDAGES/DRESSINGS) ×1 IMPLANT
COOLER ICEMAN CLASSIC (MISCELLANEOUS) ×1 IMPLANT
DRAPE IMP U-DRAPE 54X76 (DRAPES) ×1 IMPLANT
DRAPE INCISE IOBAN 66X45 STRL (DRAPES) IMPLANT
DRAPE SHOULDER BEACH CHAIR (DRAPES) ×1 IMPLANT
DW OUTFLOW CASSETTE/TUBE SET (MISCELLANEOUS) ×1 IMPLANT
GAUZE PAD ABD 8X10 STRL (GAUZE/BANDAGES/DRESSINGS) ×1 IMPLANT
GAUZE SPONGE 4X4 12PLY STRL (GAUZE/BANDAGES/DRESSINGS) ×1 IMPLANT
GLOVE BIO SURGEON STRL SZ 6.5 (GLOVE) ×1 IMPLANT
GLOVE BIOGEL PI IND STRL 6.5 (GLOVE) ×1 IMPLANT
GLOVE BIOGEL PI IND STRL 8 (GLOVE) ×1 IMPLANT
GLOVE ECLIPSE 8.0 STRL XLNG CF (GLOVE) ×1 IMPLANT
GOWN STRL REUS W/ TWL LRG LVL3 (GOWN DISPOSABLE) ×2 IMPLANT
GOWN STRL REUS W/TWL LRG LVL3 (GOWN DISPOSABLE) ×2
GOWN STRL REUS W/TWL XL LVL3 (GOWN DISPOSABLE) ×1 IMPLANT
KIT SHOULDER STAB MARCO (KITS) ×1 IMPLANT
KIT STR SPEAR 1.8 FBRTK DISP (KITS) IMPLANT
LASSO CRESCENT QUICKPASS (SUTURE) IMPLANT
MANIFOLD NEPTUNE II (INSTRUMENTS) ×1 IMPLANT
NDL HD SCORPION MEGA LOADER (NEEDLE) IMPLANT
NDL SAFETY ECLIPSE 18X1.5 (NEEDLE) ×1 IMPLANT
PACK ARTHROSCOPY DSU (CUSTOM PROCEDURE TRAY) ×1 IMPLANT
PACK BASIN DAY SURGERY FS (CUSTOM PROCEDURE TRAY) ×1 IMPLANT
PAD COLD SHLDR WRAP-ON (PAD) ×1 IMPLANT
RESTRAINT HEAD UNIVERSAL NS (MISCELLANEOUS) ×1 IMPLANT
SHEET MEDIUM DRAPE 40X70 STRL (DRAPES) ×1 IMPLANT
SLEEVE SCD COMPRESS KNEE MED (STOCKING) ×1 IMPLANT
SLING ARM FOAM STRAP LRG (SOFTGOODS) IMPLANT
SUT FIBERWIRE #2 38 T-5 BLUE (SUTURE)
SUT MNCRL AB 4-0 PS2 18 (SUTURE) ×1 IMPLANT
SUT PDS AB 0 CT 36 (SUTURE) IMPLANT
SUT PDS AB 1 CT 36 (SUTURE) IMPLANT
SUT TIGER TAPE 7 IN WHITE (SUTURE) IMPLANT
SUTURE FIBERWR #2 38 T-5 BLUE (SUTURE) IMPLANT
SUTURE TAPE TIGERLINK 1.3MM BL (SUTURE) IMPLANT
SUTURETAPE TIGERLINK 1.3MM BL (SUTURE)
SYR 5ML LL (SYRINGE) ×1 IMPLANT
TAPE FIBER 2MM 7IN #2 BLUE (SUTURE) IMPLANT
TOWEL GREEN STERILE FF (TOWEL DISPOSABLE) ×2 IMPLANT
TUBE CONNECTING 20X1/4 (TUBING) ×1 IMPLANT
TUBING ARTHROSCOPY IRRIG 16FT (MISCELLANEOUS) ×1 IMPLANT
WAND ABLATOR APOLLO I90 (BUR) ×1 IMPLANT

## 2023-04-17 NOTE — Interval H&P Note (Signed)
All questions answerd.  LEFT shoulder

## 2023-04-17 NOTE — Transfer of Care (Signed)
Immediate Anesthesia Transfer of Care Note  Patient: Deretha Azad  Procedure(s) Performed: SHOULDER ARTHROSCOPY WITH SUBACROMIAL DECOMPRESSION AND BICEP TENDON REPAIR (Right) SHOULDER ARTHROSCOPY WITH DISTAL CLAVICLE RESECTION (Right: Shoulder)  Patient Location: PACU  Anesthesia Type:GA combined with regional for post-op pain  Level of Consciousness: drowsy  Airway & Oxygen Therapy: Patient Spontanous Breathing and Patient connected to face mask oxygen  Post-op Assessment: Report given to RN and Post -op Vital signs reviewed and stable  Post vital signs: Reviewed and stable  Last Vitals:  Vitals Value Taken Time  BP 129/76 04/17/23 1035  Temp    Pulse 102 04/17/23 1036  Resp 17 04/17/23 1036  SpO2 100 % 04/17/23 1036  Vitals shown include unfiled device data.  Last Pain:  Vitals:   04/17/23 0740  TempSrc: Temporal  PainSc: 0-No pain         Complications: No notable events documented.

## 2023-04-17 NOTE — Anesthesia Procedure Notes (Signed)
Anesthesia Regional Block: Interscalene brachial plexus block   Pre-Anesthetic Checklist: , timeout performed,  Correct Patient, Correct Site, Correct Laterality,  Correct Procedure, Correct Position, site marked,  Risks and benefits discussed,  Surgical consent,  Pre-op evaluation,  At surgeon's request and post-op pain management  Laterality: Left  Prep: chloraprep       Needles:  Injection technique: Single-shot  Needle Type: Echogenic Stimulator Needle     Needle Length: 9cm  Needle Gauge: 21     Additional Needles:   Procedures:,,,, ultrasound used (permanent image in chart),,    Narrative:  Start time: 04/17/2023 8:44 AM End time: 04/17/2023 8:47 AM Injection made incrementally with aspirations every 5 mL.  Performed by: Personally  Anesthesiologist: Linton Rump, MD  Additional Notes: Discussed risks and benefits of nerve block including, but not limited to, prolonged and/or permanent nerve injury involving sensory and/or motor function. Monitors were applied and a time-out was performed. The nerve and associated structures were visualized under ultrasound guidance. After negative aspiration, local anesthetic was slowly injected around the nerve. There was no evidence of high pressure during the procedure. There were no paresthesias. VSS remained stable and the patient tolerated the procedure well.

## 2023-04-17 NOTE — Anesthesia Procedure Notes (Signed)
Procedure Name: Intubation Date/Time: 04/17/2023 9:25 AM  Performed by: Lauralyn Primes, CRNAPre-anesthesia Checklist: Patient identified, Emergency Drugs available, Suction available and Patient being monitored Patient Re-evaluated:Patient Re-evaluated prior to induction Oxygen Delivery Method: Circle system utilized Preoxygenation: Pre-oxygenation with 100% oxygen Induction Type: IV induction Ventilation: Mask ventilation without difficulty Laryngoscope Size: Mac and 3 Grade View: Grade I Tube type: Oral Tube size: 7.0 mm Number of attempts: 1 Airway Equipment and Method: Stylet and Bite block Placement Confirmation: ETT inserted through vocal cords under direct vision, positive ETCO2 and breath sounds checked- equal and bilateral Secured at: 23 cm Tube secured with: Tape Dental Injury: Teeth and Oropharynx as per pre-operative assessment

## 2023-04-17 NOTE — Anesthesia Postprocedure Evaluation (Signed)
Anesthesia Post Note  Patient: Yolanda Chandler  Procedure(s) Performed: SHOULDER ARTHROSCOPY WITH SUBACROMIAL DECOMPRESSION AND BICEP TENDON REPAIR (Right: Shoulder) SHOULDER ARTHROSCOPY WITH DISTAL CLAVICLE RESECTION (Right: Shoulder)     Patient location during evaluation: PACU Anesthesia Type: Regional and General Level of consciousness: awake Pain management: pain level controlled Vital Signs Assessment: post-procedure vital signs reviewed and stable Respiratory status: spontaneous breathing, nonlabored ventilation and respiratory function stable Cardiovascular status: blood pressure returned to baseline and stable Postop Assessment: no apparent nausea or vomiting Anesthetic complications: no   No notable events documented.  Last Vitals:  Vitals:   04/17/23 1115 04/17/23 1130  BP: 124/72 127/69  Pulse: 80 77  Resp: 15 12  Temp:    SpO2: 96% 96%    Last Pain:  Vitals:   04/17/23 1100  TempSrc:   PainSc: Asleep                 Linton Rump

## 2023-04-17 NOTE — Progress Notes (Addendum)
Assisted Dr. Jennifer Allan with left, interscalene , ultrasound guided block. Side rails up, monitors on throughout procedure. See vital signs in flow sheet. Tolerated Procedure well. 

## 2023-04-18 ENCOUNTER — Encounter (HOSPITAL_BASED_OUTPATIENT_CLINIC_OR_DEPARTMENT_OTHER): Payer: Self-pay | Admitting: Orthopaedic Surgery

## 2023-04-22 DIAGNOSIS — M6281 Muscle weakness (generalized): Secondary | ICD-10-CM | POA: Diagnosis not present

## 2023-04-22 DIAGNOSIS — M25612 Stiffness of left shoulder, not elsewhere classified: Secondary | ICD-10-CM | POA: Diagnosis not present

## 2023-04-22 DIAGNOSIS — S43432D Superior glenoid labrum lesion of left shoulder, subsequent encounter: Secondary | ICD-10-CM | POA: Diagnosis not present

## 2023-04-22 NOTE — Op Note (Signed)
Orthopaedic Surgery Operative Note (CSN: [id number])  Yolanda Chandler  1990-09-18 Date of Surgery: 04/17/2023   DIAGNOSES: Right shoulder, SLAP tear, biceps tendinitis, AC arthritis, and subacromial impingement.  POST-OPERATIVE DIAGNOSIS: same  PROCEDURE: Arthroscopic extensive debridement - 29823 Subdeltoid Bursa, Supraspinatus Tendon, Anterior Labrum, Superior Labrum, and Posterior Labrum Arthroscopic distal clavicle excision - 62952 Arthroscopic subacromial decompression - 84132 Arthroscopic biceps tenodesis - 44010   OPERATIVE FINDING: Exam under anesthesia: Normal Articular space:  Anterior and posterior labral tearing Chondral surfaces: Normal Biceps:  Type 2 slap Subscapularis: Intact  Supraspinatus: Intact  Infraspinatus: Intact    Post-operative plan: The patient will be non-weightbearing in a sling 4 weeks.  The patient will be discharged home.  DVT prophylaxis not indicated in ambulatory upper extremity patient without known risk factors.   Pain control with PRN pain medication preferring oral medicines.  Follow up plan will be scheduled in approximately 7 days for incision check and XR.  Surgeons:Primary: Bjorn Pippin, MD Assistants:Deb Lizbeth Bark Location: [facility] OR ROOM 6 Anesthesia: General with Exparel interscalene block Antibiotics: Ancef 2 g Tourniquet time: None Estimated Blood Loss: Minimal Complications: None Specimens: None Implants: Implant Name Type Inv. Item Serial No. Manufacturer Lot No. LRB No. Used Action  ANCH SUT 2 FBRTK KNTLS 1.8 - [id number] Anchor ANCH SUT 2 FBRTK KNTLS 1.8  [id number] Right 1 Implanted  ANCH SUT 2 FBRTK KNTLS 1.8 - [id number] Anchor ANCH SUT 2 FBRTK KNTLS 1.8  [id number] Right 1 Implanted    Indications for Surgery:   Yolanda Chandler is a 32 y.o. female with continued shoulder pain refractory to nonoperative measures for extended period of time.    The risks and benefits were explained at length including but not  limited to continued pain, cuff failure, biceps tenodesis failure, stiffness, need for further surgery and infection.   Procedure:   Patient was correctly identified in the preoperative holding area and operative site marked.  Patient brought to OR and positioned beachchair on an [place] table ensuring that all bony prominences were padded and the head was in an appropriate location.  Anesthesia was induced and the operative shoulder was prepped and draped in the usual sterile fashion.  Timeout was called preincision.  A standard posterior viewing portal was made after localizing the portal with a spinal needle.  An anterior accessory portal was also made.  After clearing the articular space the camera was positioned in the subacromial space.  Findings above.    Extensive debridement was performed of the anterior interval tissue, labral fraying and the bursa.  Glenoid bone, glenoid cartilage, humeral bone were all debrided.  Subacromial decompression: We made a lateral portal with spinal needle guidance. We then proceeded to debride bursal tissue extensively with a shaver and arthrocare device. At that point we continued to identify the borders of the acromion and identify the spur. We then carefully preserved the deltoid fascia and used a burr to convert the acromion to a Type 1 flat acromion without issue.  Biceps tenodesis: We marked the tendon and then performed a tenotomy and debridement of the stump in the articular space. We then identified the biceps tendon in its groove suprapec with the arthroscope in the lateral portal taking care to move from lateral to medial to avoid injury to the subscapularis. At that point we unroofed the tendon itself and mobilized it. An accessory anterior portal was made in line with the tendon and we grasped it from the anterior superior portal  and worked from the accessory anterior portal. Two [place] 1.76mm knotless anchors were placed in the groove and the tendon  was secured in a luggage loop style fashion with a pass of the limb of suture through the tendon using a scorpion device to avoid pull-through.  Repair was completed with good tension on the tendon.  Residual stump of the tendon was removed after being resected with a RF ablator.  Distal Clavicle resection:  The scope was placed in the subacromial space from the posterior portal.  A hemostat was placed through the anterior portal and we spread at the [facility] joint.  A burr was then inserted and 10 mm of distal clavicle was resected taking care to avoid damage to the capsule around the joint and avoiding overhanging bone posteriorly.     The incisions were closed with absorbable monocryl and steri strips.  A sterile dressing was placed along with a sling. The patient was awoken from general anesthesia and taken to the PACU in stable condition without complication.   Sander Radon PA-C, present and scrubbed throughout the case, critical for completion in a timely fashion, and for retraction, instrumentation, closure.

## 2023-04-25 DIAGNOSIS — S43432D Superior glenoid labrum lesion of left shoulder, subsequent encounter: Secondary | ICD-10-CM | POA: Diagnosis not present

## 2023-04-29 DIAGNOSIS — S43432D Superior glenoid labrum lesion of left shoulder, subsequent encounter: Secondary | ICD-10-CM | POA: Diagnosis not present

## 2023-04-29 DIAGNOSIS — M6281 Muscle weakness (generalized): Secondary | ICD-10-CM | POA: Diagnosis not present

## 2023-04-29 DIAGNOSIS — M25612 Stiffness of left shoulder, not elsewhere classified: Secondary | ICD-10-CM | POA: Diagnosis not present

## 2023-05-02 DIAGNOSIS — M25612 Stiffness of left shoulder, not elsewhere classified: Secondary | ICD-10-CM | POA: Diagnosis not present

## 2023-05-02 DIAGNOSIS — M6281 Muscle weakness (generalized): Secondary | ICD-10-CM | POA: Diagnosis not present

## 2023-05-02 DIAGNOSIS — S43432D Superior glenoid labrum lesion of left shoulder, subsequent encounter: Secondary | ICD-10-CM | POA: Diagnosis not present

## 2023-05-05 DIAGNOSIS — M6281 Muscle weakness (generalized): Secondary | ICD-10-CM | POA: Diagnosis not present

## 2023-05-05 DIAGNOSIS — M25612 Stiffness of left shoulder, not elsewhere classified: Secondary | ICD-10-CM | POA: Diagnosis not present

## 2023-05-05 DIAGNOSIS — S43432D Superior glenoid labrum lesion of left shoulder, subsequent encounter: Secondary | ICD-10-CM | POA: Diagnosis not present

## 2023-05-12 DIAGNOSIS — M6281 Muscle weakness (generalized): Secondary | ICD-10-CM | POA: Diagnosis not present

## 2023-05-12 DIAGNOSIS — M25612 Stiffness of left shoulder, not elsewhere classified: Secondary | ICD-10-CM | POA: Diagnosis not present

## 2023-05-12 DIAGNOSIS — S43432D Superior glenoid labrum lesion of left shoulder, subsequent encounter: Secondary | ICD-10-CM | POA: Diagnosis not present

## 2023-05-20 DIAGNOSIS — S43432D Superior glenoid labrum lesion of left shoulder, subsequent encounter: Secondary | ICD-10-CM | POA: Diagnosis not present

## 2023-05-20 DIAGNOSIS — M25612 Stiffness of left shoulder, not elsewhere classified: Secondary | ICD-10-CM | POA: Diagnosis not present

## 2023-05-20 DIAGNOSIS — M6281 Muscle weakness (generalized): Secondary | ICD-10-CM | POA: Diagnosis not present

## 2023-05-26 DIAGNOSIS — M25612 Stiffness of left shoulder, not elsewhere classified: Secondary | ICD-10-CM | POA: Diagnosis not present

## 2023-05-26 DIAGNOSIS — S43432D Superior glenoid labrum lesion of left shoulder, subsequent encounter: Secondary | ICD-10-CM | POA: Diagnosis not present

## 2023-05-26 DIAGNOSIS — M6281 Muscle weakness (generalized): Secondary | ICD-10-CM | POA: Diagnosis not present

## 2023-06-04 DIAGNOSIS — M6281 Muscle weakness (generalized): Secondary | ICD-10-CM | POA: Diagnosis not present

## 2023-06-04 DIAGNOSIS — S43432D Superior glenoid labrum lesion of left shoulder, subsequent encounter: Secondary | ICD-10-CM | POA: Diagnosis not present

## 2023-06-04 DIAGNOSIS — Z79899 Other long term (current) drug therapy: Secondary | ICD-10-CM | POA: Diagnosis not present

## 2023-06-04 DIAGNOSIS — Z13220 Encounter for screening for lipoid disorders: Secondary | ICD-10-CM | POA: Diagnosis not present

## 2023-06-04 DIAGNOSIS — M25612 Stiffness of left shoulder, not elsewhere classified: Secondary | ICD-10-CM | POA: Diagnosis not present

## 2023-06-19 DIAGNOSIS — M25612 Stiffness of left shoulder, not elsewhere classified: Secondary | ICD-10-CM | POA: Diagnosis not present

## 2023-06-19 DIAGNOSIS — M6281 Muscle weakness (generalized): Secondary | ICD-10-CM | POA: Diagnosis not present

## 2023-06-19 DIAGNOSIS — S43432D Superior glenoid labrum lesion of left shoulder, subsequent encounter: Secondary | ICD-10-CM | POA: Diagnosis not present

## 2023-06-24 DIAGNOSIS — S43432D Superior glenoid labrum lesion of left shoulder, subsequent encounter: Secondary | ICD-10-CM | POA: Diagnosis not present

## 2023-06-24 DIAGNOSIS — M25612 Stiffness of left shoulder, not elsewhere classified: Secondary | ICD-10-CM | POA: Diagnosis not present

## 2023-06-24 DIAGNOSIS — M6281 Muscle weakness (generalized): Secondary | ICD-10-CM | POA: Diagnosis not present

## 2023-07-01 DIAGNOSIS — M25612 Stiffness of left shoulder, not elsewhere classified: Secondary | ICD-10-CM | POA: Diagnosis not present

## 2023-07-01 DIAGNOSIS — M6281 Muscle weakness (generalized): Secondary | ICD-10-CM | POA: Diagnosis not present

## 2023-07-01 DIAGNOSIS — S43432D Superior glenoid labrum lesion of left shoulder, subsequent encounter: Secondary | ICD-10-CM | POA: Diagnosis not present

## 2023-07-08 DIAGNOSIS — S43432D Superior glenoid labrum lesion of left shoulder, subsequent encounter: Secondary | ICD-10-CM | POA: Diagnosis not present
